# Patient Record
Sex: Female | Born: 2005 | Race: Black or African American | Hispanic: No | Marital: Single | Smoking: Never smoker
Health system: Southern US, Community
[De-identification: ages and names within clinical notes are randomized; demographics above are authoritative.]

## PROBLEM LIST (undated history)

## (undated) DIAGNOSIS — F649 Gender identity disorder, unspecified: Secondary | ICD-10-CM

## (undated) DIAGNOSIS — G44219 Episodic tension-type headache, not intractable: Secondary | ICD-10-CM

## (undated) DIAGNOSIS — E349 Endocrine disorder, unspecified: Secondary | ICD-10-CM

## (undated) DIAGNOSIS — F900 Attention-deficit hyperactivity disorder, predominantly inattentive type: Secondary | ICD-10-CM

## (undated) DIAGNOSIS — R51 Headache: Secondary | ICD-10-CM

## (undated) DIAGNOSIS — G43009 Migraine without aura, not intractable, without status migrainosus: Secondary | ICD-10-CM

## (undated) DIAGNOSIS — F4323 Adjustment disorder with mixed anxiety and depressed mood: Secondary | ICD-10-CM

## (undated) DIAGNOSIS — F5104 Psychophysiologic insomnia: Secondary | ICD-10-CM

## (undated) HISTORY — DX: Headache: R51

## (undated) NOTE — *Deleted (*Deleted)
Shared service with APP.  I have personally seen and examined the patient, providing direct face to face care.  Physical exam findings and plan include patient presents with intermittent heart palpitations and intermittent chest pain.  No shortness of breath or chest pain at this time however patient has had sharp chest pain recently.  No cardiac history.  Plan for screening blood work including D-dimer/troponin with worsening symptoms and patient on estrogen.  Patient well-appearing on assessment.  Rhythm strip reviewed showing normal heart rate, either artifact or PACs.  Reviewed with pediatric cardiology.  Patient will need outpatient follow-up.

---
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
## 2024-02-04 ENCOUNTER — Telehealth: Payer: MEDICAID | Admitting: Nurse Practitioner

## 2024-02-04 ENCOUNTER — Encounter: Payer: Self-pay | Admitting: Nurse Practitioner

## 2024-02-04 VITALS — BP 120/82 | HR 74 | Temp 97.6°F | Resp 18 | Ht 70.0 in | Wt 273.4 lb

## 2024-02-04 DIAGNOSIS — F331 Major depressive disorder, recurrent, moderate: Secondary | ICD-10-CM | POA: Insufficient documentation

## 2024-02-04 DIAGNOSIS — F900 Attention-deficit hyperactivity disorder, predominantly inattentive type: Secondary | ICD-10-CM | POA: Diagnosis not present

## 2024-02-04 DIAGNOSIS — F419 Anxiety disorder, unspecified: Secondary | ICD-10-CM | POA: Diagnosis not present

## 2024-02-04 NOTE — Assessment & Plan Note (Addendum)
 Continue Wellbutrin XL 150 mg PO QAM

## 2024-02-04 NOTE — Assessment & Plan Note (Addendum)
 Continue Clonidine  0.2 mg PO QHS Continue Concerta Er 27 mg PO QAM

## 2024-02-04 NOTE — Progress Notes (Signed)
 Subjective:  I'm doing pretty good    HPI: Alicia Powell is a 18 y.o. adult presenting on 02/04/2024 via telehealth. She is a previous patient of this provider at previous practice.    Patient reports she is doing well overall on her medications with no new mental health issues, concerns or complaints. She is taking 2 classes  at [employer] right now and reports school is going well. She reports continued improvement with focus, concentration and attention span. She also reports continued improvement with decreased anxiety, depressive symptoms, apprehensions and moods swings.    Patient denies poor appetite, sleep issues, adverse reaction to her medications, psychosis, delusions, suicidal or homicidal ideations.   ROS: Negative unless specifically indicated above in HPI.   Relevant past medical history reviewed and updated as indicated.   Allergies and medications reviewed and updated.   Current Outpatient Medications  Medication Instructions   alum & mag hydroxide-simeth (MAALOX MAX) 400-400-40 MG/5ML suspension 15 mLs, Oral, Every 6 hours PRN   busPIRone  (BUSPAR ) 10 mg, Oral, 2 times daily   citalopram  (CELEXA ) 20 mg, Oral, Daily at bedtime   cloNIDine  (CATAPRES ) 0.2 mg, Oral, Daily at bedtime   Concerta 27 mg, Oral, Every morning   famotidine  (PEPCID ) 20 mg, Oral, 2 times daily PRN   hydrOXYzine  (ATARAX ) 10 mg, Oral, Daily at bedtime   Insulin  Syringe-Needle U-100 (INSULIN  SYRINGE .5CC/28G) 28G X 1/2 0.5 ML MISC Use as directed with testosterone    norethindrone  (AYGESTIN ) 5 mg, Oral, Daily, Take 2 tabs for spotting or 3 tabs for bleeding. Return to 1 tab when bleeding stops.   Testosterone  Cypionate 50 mg, Subcutaneous, Weekly   testosterone  enanthate (DELATESTRYL ) 40 mg, Intramuscular, Every 7 days, This is 20 units on insulin  syringe. OK to give SUBCUTANEOUS     No Known Allergies  Objective:   BP 120/82 (BP Location: Right Arm, Patient Position: Sitting, Cuff Size:  Normal)   Pulse 74   Temp 97.6 F (36.4 C) (Temporal)   Resp 18   Ht 5' 10 (1.778 m)   Wt 273 lb 6.4 oz (124 kg)   SpO2 99%   BMI 39.23 kg/m    Physical Exam Constitutional:      Appearance: Normal appearance.  Neurological:     General: No focal deficit present.     Mental Status: He is alert and oriented to person, place, and time.  Psychiatric:        Attention and Perception: Attention and perception normal.        Mood and Affect: Mood normal. Mood is not anxious.        Speech: Speech normal.        Behavior: Behavior normal.        Thought Content: Thought content normal. Thought content is not paranoid or delusional. Thought content does not include homicidal or suicidal ideation.        Cognition and Memory: Cognition and memory normal.        Judgment: Judgment normal.    Assessment & Plan:   Assessment & Plan Attention deficit hyperactivity disorder (ADHD), predominantly inattentive type Continue Clonidine  0.2 mg PO QHS Continue Concerta Er 27 mg PO QAM    Anxiety Continue Buspar  15 mg PO BID     Moderate episode of recurrent major depressive disorder (HCC) Continue Wellbutrin XL 150 mg PO QAM        Follow up plan: Return in about 4 weeks (around 03/03/2024) for Medication Follow-up.  Florencia Cousin, NP

## 2024-02-04 NOTE — Assessment & Plan Note (Addendum)
 Continue Buspar  15 mg PO BID

## 2024-03-10 ENCOUNTER — Telehealth: Payer: MEDICAID | Admitting: Nurse Practitioner

## 2024-03-16 NOTE — H&P (Signed)
 Subjective Patient ID: Alicia Powell is a 18 y.o. adult.     HPI   Returns for follow up consultation gender affirming surgery, last seen 02/2023. Patient has identified as female with preferred name Alicia Powell for over 3 years in all aspects of life including home and school.    On testosterone  for 3.5 years.   Wt down 26 lb since 2024 consult.   No prior MMG. Reports MGM's sister with breast ca.   First year student [employer] in [employer]. Lives with parents who are supportive.    Review of Systems  Constitutional:  Positive for fatigue.  Cardiovascular:  Positive for chest pain.  Psychiatric/Behavioral:  Positive for dysphoric mood. The patient is nervous/anxious.   All other systems reviewed and are negative.    Objective Physical Exam  Cardiovascular: Normal rate, regular rhythm and normal heart sounds.    Pulmonary/Chest Effort normal and breath sounds normal.    Skin   Fitzpatrick 6     Lymph: no palpable axillary adenopathy   Breasts: no palpable masses, grade 3 ptosis bilateral SN to nipple R 30 L 30.6 cm BW R 24 L 23 cm Nipple to IMF R 9 L 9 cm Assessment/Plan Gender dysphoria Morbid obesity with BMI of 40.0-44.9, adult (CMD)   Plan bilateral mastectomies with free nipple grafts.    Discussed with patient mastectomy does not remove 100% breast tissue and will need to continue with self exams with aging with regards to breast cancer risk. Reviewed mastectomy will leave chest skin and nipple areola without feeling or ability to stimulate.    Given breast size and ptosis, discussed double incision mastectomy with FNG. Reviewed scars, drains, OP sugery, post op limitation pain and recovery. Reviewed with FNG able to reduce nipple height and areola diameter to approach cis female appearance but risks failure graft and hypopigmentation, may need additional procedures for this. Also this will leave nipple permanently without sensation and nipple will not stimulate.  Reviewed anticipated scar length, including across midline, and scar maturation over months. Plan FNGs. Reviewed common to have soft tissue excess lateral chest wall and may require additional procedures to treat this, weight loss would help reduce this risk.    Additional risks including but not limited to bleeding, infection, wound healing problems, asymmetry, damage to adjacent structures, need for additional procedures, hematoma, seroma, blood clots in legs or lungs reviewed. Completed ASPS consent for gender affirming chest surgery.   Patient is ok with breast binder placement at end of surgery.   Drain teaching completed. Rx for tramadol given.   Earlis Ranks, MD [facility]  Office/ physician access line after hours [phone]

## 2024-03-18 ENCOUNTER — Other Ambulatory Visit: Payer: Self-pay

## 2024-03-18 ENCOUNTER — Encounter (HOSPITAL_BASED_OUTPATIENT_CLINIC_OR_DEPARTMENT_OTHER): Payer: Self-pay | Admitting: Plastic Surgery

## 2024-03-18 NOTE — Progress Notes (Signed)
 Pt reports recent positive COVID test (02-09-24) with symptoms (runny nose, cough, sore throat, no fever) which resolved in 3 days.  Reviewed with Dr Corinne (anesthesia) who said OK to proceed with surgery as scheduled.

## 2024-03-18 NOTE — Progress Notes (Signed)
   03/18/24 1115  Pre-op Phone Call  Surgery Date Verified 03/25/24  Arrival Time Verified 1000  Surgery Location Verified [facility] [place]  Medical History Reviewed Yes  Is the patient taking a GLP-1 receptor agonist? No  Does the patient have diabetes? No diagnosis of diabetes  Do you have a history of heart problems? No  Does patient have other implanted devices? No  Patient Teaching Pre / Post Procedure  Patient educated about smoking cessation 24 hours prior to surgery. N/A Non-Smoker  Patient verbalizes understanding of bowel prep? N/A  Med Rec Completed Yes  Take the Following Meds the Morning of Surgery no meds DOS; hold nsaid/supp/vit x5d  Recent  Lab Work, EKG, CXR? No  NPO (Including gum & candy) After midnight  Patient instructed to stop clear liquids including Carb loading drink at: 0800  Stop Solids, Milk, Candy, and Gum STARTING AT MIDNIGHT  Responsible adult to drive and be with you for 24 hours? Yes  Name & Phone Number for Ride/Caregiver mom and/or dad  No Jewelry, money, nail polish or make-up.  No lotions, powders, perfumes. No shaving  48 hrs. prior to surgery. Yes  Contacts, Dentures & Glasses Will Have to be Removed Before OR. Yes  Please bring your ID and Insurance Card the morning of your surgery. (Surgery Centers Only) Yes  Bring any papers or x-rays with you that your surgeon gave you. Yes  Instructed to contact the location of procedure/ provider if they or anyone in their household develops symptoms or tests positive for COVID-19, has close contact with someone who tests positive for COVID, or has known exposure to any contagious illness. Yes  Call this number the morning of surgery  with any problems that may cancel your surgery. [phone]  Covid-19 Assessment  Have you had a positive COVID-19 test within the previous 90 days? (S)  Yes (02-09-24 positive test. Per pt: runny nose, cough, sore throat, no fever... symptoms resolved in 3 days and symptom free since then  with no residual (will disucss with anesthesia))  COVID Testing Guidance Patients with a positive COVID test should not be retested within 90 days. If patient tested positive within the previous 10 days (21 days if immunocompromised or required hospitalization for COVID) treat the patient as COVID positive (airborne/contact isolation) and notify anesthesiologist/surgeon to determine if surgery needs to be delayed.

## 2024-03-25 ENCOUNTER — Ambulatory Visit (HOSPITAL_BASED_OUTPATIENT_CLINIC_OR_DEPARTMENT_OTHER): Payer: MEDICAID | Admitting: Anesthesiology

## 2024-03-25 ENCOUNTER — Encounter (HOSPITAL_BASED_OUTPATIENT_CLINIC_OR_DEPARTMENT_OTHER): Payer: Self-pay | Admitting: Plastic Surgery

## 2024-03-25 ENCOUNTER — Encounter (HOSPITAL_BASED_OUTPATIENT_CLINIC_OR_DEPARTMENT_OTHER)
Admission: RE | Disposition: A | Discharge status: Home / Self Care | Payer: Self-pay | Source: Home / Self Care | Attending: Plastic Surgery

## 2024-03-25 ENCOUNTER — Ambulatory Visit (HOSPITAL_BASED_OUTPATIENT_CLINIC_OR_DEPARTMENT_OTHER)
Admission: RE | Admit: 2024-03-25 | Discharge: 2024-03-25 | Disposition: A | Discharge status: Home / Self Care | Payer: MEDICAID | Attending: Plastic Surgery | Admitting: Plastic Surgery

## 2024-03-25 DIAGNOSIS — F649 Gender identity disorder, unspecified: Secondary | ICD-10-CM | POA: Insufficient documentation

## 2024-03-25 DIAGNOSIS — F32A Depression, unspecified: Secondary | ICD-10-CM | POA: Insufficient documentation

## 2024-03-25 DIAGNOSIS — F419 Anxiety disorder, unspecified: Secondary | ICD-10-CM | POA: Insufficient documentation

## 2024-03-25 DIAGNOSIS — Z419 Encounter for procedure for purposes other than remedying health state, unspecified: Secondary | ICD-10-CM

## 2024-03-25 DIAGNOSIS — Z68.41 Body mass index (BMI) pediatric, 120% of the 95th percentile for age to less than 140% of the 95th percentile for age: Secondary | ICD-10-CM | POA: Insufficient documentation

## 2024-03-25 DIAGNOSIS — R519 Headache, unspecified: Secondary | ICD-10-CM | POA: Insufficient documentation

## 2024-03-25 HISTORY — PX: TOTAL MASTECTOMY: SHX6129

## 2024-03-25 HISTORY — PX: SKIN FULL THICKNESS GRAFT: SHX442

## 2024-03-25 LAB — POCT PREGNANCY, URINE: Preg Test, Ur: NEGATIVE

## 2024-03-25 SURGERY — MASTECTOMY, SIMPLE
Anesthesia: General | Site: Breast | Laterality: Bilateral

## 2024-03-25 MED ORDER — LIDOCAINE HCL (CARDIAC) PF 100 MG/5ML IV SOSY
PREFILLED_SYRINGE | INTRAVENOUS | Status: DC | PRN
  Administered 2024-03-25: 100 mg via INTRAVENOUS

## 2024-03-25 MED ORDER — FENTANYL CITRATE (PF) 100 MCG/2ML IJ SOLN
INTRAMUSCULAR | Status: AC
  Filled 2024-03-25: qty 2

## 2024-03-25 MED ORDER — LACTATED RINGERS IV SOLN
INTRAVENOUS | Status: DC
Start: 2024-03-25 — End: 2024-03-25

## 2024-03-25 MED ORDER — ROCURONIUM BROMIDE 100 MG/10ML IV SOLN
INTRAVENOUS | Status: DC | PRN
  Administered 2024-03-25: 70 mg via INTRAVENOUS

## 2024-03-25 MED ORDER — ACETAMINOPHEN 500 MG PO TABS
1000.0000 mg | ORAL_TABLET | ORAL | Status: AC
  Administered 2024-03-25: 1000 mg via ORAL

## 2024-03-25 MED ORDER — CHLORHEXIDINE GLUCONATE CLOTH 2 % EX PADS: 6.0000 | MEDICATED_PAD | Freq: Once | CUTANEOUS | Status: DC

## 2024-03-25 MED ORDER — ROCURONIUM BROMIDE 10 MG/ML (PF) SYRINGE
PREFILLED_SYRINGE | INTRAVENOUS | Status: AC
  Filled 2024-03-25: qty 10

## 2024-03-25 MED ORDER — DEXAMETHASONE SOD PHOSPHATE PF 10 MG/ML IJ SOLN
INTRAMUSCULAR | Status: DC | PRN
  Administered 2024-03-25: 10 mg via INTRAVENOUS

## 2024-03-25 MED ORDER — PHENYLEPHRINE 80 MCG/ML (10ML) SYRINGE FOR IV PUSH (FOR BLOOD PRESSURE SUPPORT)
PREFILLED_SYRINGE | INTRAVENOUS | Status: AC
Start: 2024-03-25 — End: 2024-03-25
  Filled 2024-03-25: qty 10

## 2024-03-25 MED ORDER — OXYCODONE HCL 5 MG PO TABS: 5.0000 mg | ORAL_TABLET | Freq: Once | ORAL | Status: DC | PRN

## 2024-03-25 MED ORDER — PROPOFOL 10 MG/ML IV BOLUS
INTRAVENOUS | Status: AC
  Filled 2024-03-25: qty 20

## 2024-03-25 MED ORDER — PROPOFOL 10 MG/ML IV BOLUS
INTRAVENOUS | Status: DC | PRN
  Administered 2024-03-25: 200 mg via INTRAVENOUS

## 2024-03-25 MED ORDER — BUPIVACAINE HCL (PF) 0.5 % IJ SOLN
INTRAMUSCULAR | Status: DC | PRN
  Administered 2024-03-25: 30 mL

## 2024-03-25 MED ORDER — MIDAZOLAM HCL 5 MG/5ML IJ SOLN
INTRAMUSCULAR | Status: DC | PRN
  Administered 2024-03-25: 2 mg via INTRAVENOUS

## 2024-03-25 MED ORDER — GABAPENTIN 300 MG PO CAPS
300.0000 mg | ORAL_CAPSULE | ORAL | Status: AC
  Administered 2024-03-25: 300 mg via ORAL

## 2024-03-25 MED ORDER — BUPIVACAINE HCL (PF) 0.5 % IJ SOLN
INTRAMUSCULAR | Status: AC
Start: 2024-03-25 — End: 2024-03-25
  Filled 2024-03-25: qty 30

## 2024-03-25 MED ORDER — CEFAZOLIN SODIUM-DEXTROSE 2-4 GM/100ML-% IV SOLN
INTRAVENOUS | Status: AC
  Filled 2024-03-25: qty 100

## 2024-03-25 MED ORDER — GABAPENTIN 300 MG PO CAPS
ORAL_CAPSULE | ORAL | Status: AC
Start: 2024-03-25 — End: 2024-03-25
  Filled 2024-03-25: qty 1

## 2024-03-25 MED ORDER — ONDANSETRON HCL 4 MG/2ML IJ SOLN
INTRAMUSCULAR | Status: DC | PRN
  Administered 2024-03-25: 4 mg via INTRAVENOUS

## 2024-03-25 MED ORDER — ACETAMINOPHEN 500 MG PO TABS
ORAL_TABLET | ORAL | Status: AC
  Filled 2024-03-25: qty 2

## 2024-03-25 MED ORDER — FENTANYL CITRATE (PF) 100 MCG/2ML IJ SOLN
25.0000 ug | INTRAMUSCULAR | Status: DC | PRN
  Administered 2024-03-25: 25 ug via INTRAVENOUS

## 2024-03-25 MED ORDER — CELECOXIB 200 MG PO CAPS
ORAL_CAPSULE | ORAL | Status: AC
  Filled 2024-03-25: qty 1

## 2024-03-25 MED ORDER — CEFAZOLIN SODIUM-DEXTROSE 2-4 GM/100ML-% IV SOLN
2.0000 g | INTRAVENOUS | Status: AC
  Administered 2024-03-25: 2 g via INTRAVENOUS

## 2024-03-25 MED ORDER — CELECOXIB 200 MG PO CAPS
200.0000 mg | ORAL_CAPSULE | ORAL | Status: AC
  Administered 2024-03-25: 200 mg via ORAL

## 2024-03-25 MED ORDER — PHENYLEPHRINE HCL (PRESSORS) 10 MG/ML IV SOLN
INTRAVENOUS | Status: DC | PRN
  Administered 2024-03-25 (×4): 80 ug via INTRAVENOUS

## 2024-03-25 MED ORDER — KETAMINE HCL 50 MG/5ML IJ SOSY
PREFILLED_SYRINGE | INTRAMUSCULAR | Status: AC
Start: 2024-03-25 — End: 2024-03-25
  Filled 2024-03-25: qty 5

## 2024-03-25 MED ORDER — BACITRACIN ZINC 500 UNIT/GM EX OINT
TOPICAL_OINTMENT | CUTANEOUS | Status: AC
  Filled 2024-03-25: qty 0.9

## 2024-03-25 MED ORDER — ONDANSETRON HCL 4 MG/2ML IJ SOLN: 4.0000 mg | Freq: Once | INTRAMUSCULAR | Status: DC | PRN

## 2024-03-25 MED ORDER — BACITRACIN 500 UNIT/GM EX OINT
TOPICAL_OINTMENT | CUTANEOUS | Status: DC | PRN
  Administered 2024-03-25: 1 via TOPICAL

## 2024-03-25 MED ORDER — MIDAZOLAM HCL 2 MG/2ML IJ SOLN
INTRAMUSCULAR | Status: AC
Start: 2024-03-25 — End: 2024-03-25
  Filled 2024-03-25: qty 2

## 2024-03-25 MED ORDER — PROPOFOL 500 MG/50ML IV EMUL
INTRAVENOUS | Status: AC
  Filled 2024-03-25: qty 50

## 2024-03-25 MED ORDER — FENTANYL CITRATE (PF) 100 MCG/2ML IJ SOLN
INTRAMUSCULAR | Status: DC | PRN
  Administered 2024-03-25 (×2): 50 ug via INTRAVENOUS

## 2024-03-25 MED ORDER — SUGAMMADEX SODIUM 200 MG/2ML IV SOLN
INTRAVENOUS | Status: DC | PRN
  Administered 2024-03-25: 200 mg via INTRAVENOUS

## 2024-03-25 MED ORDER — ACETAMINOPHEN 10 MG/ML IV SOLN: 1000.0000 mg | Freq: Once | INTRAVENOUS | Status: DC | PRN

## 2024-03-25 MED ORDER — PROPOFOL 500 MG/50ML IV EMUL
INTRAVENOUS | Status: DC | PRN
  Administered 2024-03-25: 50 ug/kg/min via INTRAVENOUS

## 2024-03-25 MED ORDER — OXYCODONE HCL 5 MG/5ML PO SOLN: 5.0000 mg | Freq: Once | ORAL | Status: DC | PRN

## 2024-03-25 MED ORDER — LIDOCAINE 2% (20 MG/ML) 5 ML SYRINGE
INTRAMUSCULAR | Status: AC
  Filled 2024-03-25: qty 5

## 2024-03-25 MED ORDER — 0.9 % SODIUM CHLORIDE (POUR BTL) OPTIME
TOPICAL | Status: DC | PRN
  Administered 2024-03-25: 100 mL

## 2024-03-25 MED ORDER — ONDANSETRON HCL 4 MG/2ML IJ SOLN
INTRAMUSCULAR | Status: AC
Start: 2024-03-25 — End: 2024-03-25
  Filled 2024-03-25: qty 2

## 2024-03-25 MED ORDER — KETAMINE HCL 10 MG/ML IJ SOLN
INTRAMUSCULAR | Status: DC | PRN
  Administered 2024-03-25: 20 mg via INTRAVENOUS
  Administered 2024-03-25: 10 mg via INTRAVENOUS
  Administered 2024-03-25: 20 mg via INTRAVENOUS

## 2024-03-25 SURGICAL SUPPLY — 50 items
BINDER BREAST XXLRG (GAUZE/BANDAGES/DRESSINGS) IMPLANT
BLADE CLIPPER SURG (BLADE) IMPLANT
BLADE SURG 10 STRL SS (BLADE) ×2 IMPLANT
BLADE SURG 15 STRL LF DISP TIS (BLADE) ×2 IMPLANT
BRUSH SCRUB EZ PLAIN DRY (MISCELLANEOUS) ×1 IMPLANT
CANISTER SUCT 1200ML W/VALVE (MISCELLANEOUS) ×1 IMPLANT
CHLORAPREP W/TINT 26 (MISCELLANEOUS) ×2 IMPLANT
COVER BACK TABLE 60X90IN (DRAPES) ×1 IMPLANT
COVER MAYO STAND STRL (DRAPES) ×1 IMPLANT
DERMABOND ADVANCED .7 DNX12 (GAUZE/BANDAGES/DRESSINGS) ×2 IMPLANT
DRAIN CHANNEL 19F RND (DRAIN) IMPLANT
DRAPE TOP ARMCOVERS (MISCELLANEOUS) ×1 IMPLANT
DRAPE U-SHAPE 76X120 STRL (DRAPES) ×1 IMPLANT
DRAPE UTILITY XL STRL (DRAPES) ×1 IMPLANT
DRSG EMULSION OIL 3X3 NADH (GAUZE/BANDAGES/DRESSINGS) ×1 IMPLANT
ELECT COATED BLADE 2.86 ST (ELECTRODE) ×1 IMPLANT
ELECT NDL BLADE 2-5/6 (NEEDLE) ×1 IMPLANT
ELECT NEEDLE BLADE 2-5/6 (NEEDLE) ×1 IMPLANT
ELECTRODE REM PT RTRN 9FT ADLT (ELECTROSURGICAL) ×1 IMPLANT
EVACUATOR SILICONE 100CC (DRAIN) IMPLANT
GAUZE PAD ABD 8X10 STRL (GAUZE/BANDAGES/DRESSINGS) ×2 IMPLANT
GLOVE BIO SURGEON STRL SZ 6 (GLOVE) ×2 IMPLANT
GLOVE BIOGEL PI IND STRL 7.5 (GLOVE) IMPLANT
GLOVE SURG SYN 7.0 PF PI (GLOVE) IMPLANT
GOWN STRL REUS W/ TWL LRG LVL3 (GOWN DISPOSABLE) ×2 IMPLANT
GOWN STRL REUS W/ TWL XL LVL3 (GOWN DISPOSABLE) IMPLANT
NDL HYPO 25X1 1.5 SAFETY (NEEDLE) ×1 IMPLANT
NEEDLE HYPO 25X1 1.5 SAFETY (NEEDLE) ×1 IMPLANT
PACK BASIN DAY SURGERY FS (CUSTOM PROCEDURE TRAY) ×1 IMPLANT
PENCIL SMOKE EVACUATOR (MISCELLANEOUS) ×1 IMPLANT
PIN SAFETY STERILE (MISCELLANEOUS) IMPLANT
SHEET MEDIUM DRAPE 40X70 STRL (DRAPES) ×1 IMPLANT
SLEEVE SCD COMPRESS KNEE MED (STOCKING) ×1 IMPLANT
SOLN 0.9% NACL POUR BTL 1000ML (IV SOLUTION) ×1 IMPLANT
SPONGE T-LAP 18X18 ~~LOC~~+RFID (SPONGE) ×2 IMPLANT
STAPLER SKIN PROX WIDE 3.9 (STAPLE) ×1 IMPLANT
SUT ETHILON 2 0 FS 18 (SUTURE) IMPLANT
SUT MNCRL AB 4-0 PS2 18 (SUTURE) IMPLANT
SUT PDS AB 2-0 CT2 27 (SUTURE) IMPLANT
SUT SILK 2 0 SH (SUTURE) IMPLANT
SUT SILK 4 0 PS 2 (SUTURE) IMPLANT
SUT VIC AB 3-0 PS1 18XBRD (SUTURE) IMPLANT
SUT VICRYL RAPIDE 4-0 (SUTURE) IMPLANT
SYR 10ML LL (SYRINGE) ×1 IMPLANT
SYR BULB IRRIG 60ML STRL (SYRINGE) ×1 IMPLANT
SYR CONTROL 10ML LL (SYRINGE) ×1 IMPLANT
TOWEL GREEN STERILE FF (TOWEL DISPOSABLE) ×2 IMPLANT
TUBE CONNECTING 20X1/4 (TUBING) ×1 IMPLANT
UNDERPAD 30X36 HEAVY ABSORB (UNDERPADS AND DIAPERS) ×2 IMPLANT
YANKAUER SUCT BULB TIP NO VENT (SUCTIONS) ×1 IMPLANT

## 2024-03-25 NOTE — Anesthesia Preprocedure Evaluation (Addendum)
 Anesthesia Evaluation  Patient identified by MRN, date of birth, ID band Patient awake    Reviewed: Allergy & Precautions, NPO status , Patient's Chart, lab work & pertinent test results, reviewed documented beta blocker date and time   History of Anesthesia Complications Negative for: history of anesthetic complications  Airway Mallampati: II  TM Distance: >3 FB     Dental no notable dental hx.    Pulmonary neg COPD, neg PE   breath sounds clear to auscultation       Cardiovascular (-) angina (-) CAD and (-) Past MI  Rhythm:Regular Rate:Normal     Neuro/Psych  Headaches, neg Seizures PSYCHIATRIC DISORDERS Anxiety Depression       GI/Hepatic ,neg GERD  ,,(+) neg Cirrhosis        Endo/Other    Renal/GU Renal disease     Musculoskeletal   Abdominal   Peds  Hematology   Anesthesia Other Findings   Reproductive/Obstetrics                              Anesthesia Physical Anesthesia Plan  ASA: 2  Anesthesia Plan: General   Post-op Pain Management:    Induction: Intravenous  PONV Risk Score and Plan: 2 and Ondansetron and Dexamethasone  Airway Management Planned: Oral ETT  Additional Equipment:   Intra-op Plan:   Post-operative Plan: Extubation in OR  Informed Consent: I have reviewed the patients History and Physical, chart, labs and discussed the procedure including the risks, benefits and alternatives for the proposed anesthesia with the patient or authorized representative who has indicated his/her understanding and acceptance.     Dental advisory given  Plan Discussed with: CRNA  Anesthesia Plan Comments:          Anesthesia Quick Evaluation

## 2024-03-25 NOTE — Anesthesia Postprocedure Evaluation (Signed)
 Anesthesia Post Note  Patient: Alicia Powell  Procedure(s) Performed: MASTECTOMY, SIMPLE (Bilateral: Breast) APPLICATION, GRAFT, SKIN, FULL-THICKNESS (Bilateral: Breast)     Patient location during evaluation: PACU Anesthesia Type: General Level of consciousness: awake and alert Pain management: pain level controlled Vital Signs Assessment: post-procedure vital signs reviewed and stable Respiratory status: spontaneous breathing, nonlabored ventilation, respiratory function stable and patient connected to nasal cannula oxygen Cardiovascular status: blood pressure returned to baseline and stable Postop Assessment: no apparent nausea or vomiting Anesthetic complications: no   No notable events documented.  Last Vitals:  Vitals:   03/25/24 1600 03/25/24 1700  BP: 122/73 128/85  Pulse: 94 90  Resp: 19 20  Temp:  (!) 36.3 C  SpO2: 95% 93%    Last Pain:  Vitals:   03/25/24 1700  TempSrc: Temporal  PainSc:                  Lynwood MARLA Cornea

## 2024-03-25 NOTE — Transfer of Care (Signed)
 Immediate Anesthesia Transfer of Care Note  Patient: Alicia Powell  Procedure(s) Performed: MASTECTOMY, SIMPLE (Bilateral: Breast) APPLICATION, GRAFT, SKIN, FULL-THICKNESS (Bilateral: Breast)  Patient Location: PACU  Anesthesia Type:General  Level of Consciousness: drowsy and patient cooperative  Airway & Oxygen Therapy: Patient Spontanous Breathing and Patient connected to face mask oxygen  Post-op Assessment: Report given to RN and Post -op Vital signs reviewed and stable  Post vital signs: Reviewed and stable  Last Vitals:  Vitals Value Taken Time  BP 122/69 03/25/24 15:22  Temp    Pulse 100 03/25/24 15:22  Resp 14 03/25/24 15:22  SpO2 99 % 03/25/24 15:22  Vitals shown include unfiled device data.  Last Pain:  Vitals:   03/25/24 1004  TempSrc: Temporal  PainSc: 0-No pain         Complications: No notable events documented.

## 2024-03-25 NOTE — Discharge Instructions (Addendum)
  Post Anesthesia Home Care Instructions  Activity: Get plenty of rest for the remainder of the day. A responsible individual must stay with you for 24 hours following the procedure.  For the next 24 hours, DO NOT: -Drive a car -Advertising copywriter -Drink alcoholic beverages -Take any medication unless instructed by your physician -Make any legal decisions or sign important papers.  Meals: Start with liquid foods such as gelatin or soup. Progress to regular foods as tolerated. Avoid greasy, spicy, heavy foods. If nausea and/or vomiting occur, drink only clear liquids until the nausea and/or vomiting subsides. Call your physician if vomiting continues.  Special Instructions/Symptoms: Your throat may feel dry or sore from the anesthesia or the breathing tube placed in your throat during surgery. If this causes discomfort, gargle with warm salt water. The discomfort should disappear within 24 hours.  If you had a scopolamine patch placed behind your ear for the management of post- operative nausea and/or vomiting:  1. The medication in the patch is effective for 72 hours, after which it should be removed.  Wrap patch in a tissue and discard in the trash. Wash hands thoroughly with soap and water. 2. You may remove the patch earlier than 72 hours if you experience unpleasant side effects which may include dry mouth, dizziness or visual disturbances. 3. Avoid touching the patch. Wash your hands with soap and water after contact with the patch.    Next dose of tylenol will be at 4pm Next dose of ibuprofen  will be after 6pm

## 2024-03-25 NOTE — Interval H&P Note (Signed)
 History and Physical Interval Note:  03/25/2024 10:23 AM  Alicia Powell  has presented today for surgery, with the diagnosis of gender dysphoria.  The various methods of treatment have been discussed with the patient and family. After consideration of risks, benefits and other options for treatment, the patient has consented to  bilateral mastectomies with free nipple grafts as a surgical intervention.  The patient's history has been reviewed, patient examined, no change in status, stable for surgery.  I have reviewed the patient's chart and labs.  Questions were answered to the patient's satisfaction.     Earlis Sophiya Morello

## 2024-03-25 NOTE — Anesthesia Procedure Notes (Signed)
 Procedure Name: Intubation Date/Time: 03/25/2024 11:16 AM  Performed by: Frost Kayla MATSU, CRNAPre-anesthesia Checklist: Patient identified, Emergency Drugs available, Suction available and Patient being monitored Patient Re-evaluated:Patient Re-evaluated prior to induction Oxygen Delivery Method: Circle system utilized Preoxygenation: Pre-oxygenation with 100% oxygen Induction Type: IV induction Ventilation: Mask ventilation without difficulty Laryngoscope Size: Miller and 3 Grade View: Grade I Tube type: Oral Tube size: 7.0 mm Number of attempts: 1 Airway Equipment and Method: Stylet and Oral airway Placement Confirmation: ETT inserted through vocal cords under direct vision, positive ETCO2 and breath sounds checked- equal and bilateral Secured at: 21 cm Tube secured with: Tape Dental Injury: Teeth and Oropharynx as per pre-operative assessment

## 2024-03-25 NOTE — Op Note (Signed)
 Operative Note   DATE OF OPERATION: 11.7.2025  LOCATION: [facility]-outpatient  SURGICAL DIVISION: Plastic Surgery  PREOPERATIVE DIAGNOSES:  Gender dysphoria  POSTOPERATIVE DIAGNOSES:  same  PROCEDURE:  Bilateral mastectomies with free nipple grafts  SURGEON: Earlis Ranks MD MBA  ASSISTANT: none  ANESTHESIA:  General.   EBL: 50 ml  COMPLICATIONS: None immediate.   INDICATIONS FOR PROCEDURE:  The patient, Alicia Powell, is a 18 y.o. adult born on 2005-07-31, is here for gender affirming chest surgery.   FINDINGS: Bilateral simple mastectomies completed with free nipple grafts.  DESCRIPTION OF PROCEDURE:  The patient's operative site was marked with the patient in the preoperative area. Patient marked in standing position for anticipated skin resection. The patient was taken to the operating room. SCDs were placed and IV antibiotics were given. The patient's operative site was prepped and draped in a sterile fashion. A time out was performed and all information was confirmed to be correct. Transverse elliptical excision drawn over bilateral breast with lower border superior to inframammary fold to include NAC.  I began on left breast. The nipple areolar complex marked with 25 mm diameter. The NAC was incised and harvested as full thickness graft. The graft was placed in saline guaze. Remainder incisions completed and subcutaneous dissection completed to all clinical extents of breast. Inferiorly dissection completed to ensure disruption inframammary fold. The breast was then dissected off chest wall taking care to leave pectoralis fascia intact. The cavity was irrigated and hemostasis obtained. Local anesthetic infiltrated. 19 Fr JP placed and secured with 2-0 nylon. Progressive tension sutures placed with 2-0 PDS to advance the mastectomy flap inferiorly. IMF incision closed with interrupted 2-0 PDS suture as 3 point suture of superficial fascia of flaps to chest wall. 3-0  vicryl placed in dermis followed by 4-0 monocyl subcuticular skin closure.   I then directed attention to right breast. Similar full thickness NAC graft harvested. Mastectomy completed and drain placed. Local anesthetic infiltrated. Closure completed in similar fashion following placement of progressive tension sutures. Tissue adhesive applied to IMF incisions.  Location for NAC grafts marked symmetrically over chest, 14 cm from midline just medial to pectoralis border. Areas for inset grafts were deepithelialized. Grafts prepared by excision of breast tissue from under surface. The grafts were inset with 4-0 vicryl rapide suture. Antibiotic ointment applied followed by adaptic and sponge bolster dressing secured with 4-0 silk. Dry dressing and breast binder applied.   The patient was allowed to wake from anesthesia, extubated and taken to the recovery room in satisfactory condition.   SPECIMENS: right and left mastectomy  DRAINS: 19 Fr JP in right and left subcutaneous chest  Earlis Ranks, MD [facility] Plastic & Reconstructive Surgery  Office/ physician access line after hours [phone]

## 2024-03-26 ENCOUNTER — Encounter (HOSPITAL_BASED_OUTPATIENT_CLINIC_OR_DEPARTMENT_OTHER): Payer: Self-pay | Admitting: Plastic Surgery

## 2024-03-29 LAB — SURGICAL PATHOLOGY

## 2024-06-02 ENCOUNTER — Telehealth: Payer: MEDICAID | Admitting: Nurse Practitioner

## 2024-06-02 NOTE — Progress Notes (Unsigned)
" ° °  Subjective:  No chief complaint on file.    HPI: Alicia Powell is a 19 y.o. adult presenting on 06/02/2024 with report of ***  Taking 4 classes 2 and 2 block courses  ROS: Negative unless specifically indicated above in HPI.   Relevant past medical history reviewed and updated as indicated.   Allergies and medications reviewed and updated.   Current Outpatient Medications  Medication Instructions   busPIRone  (BUSPAR ) 10 mg, 2 times daily   cloNIDine  (CATAPRES ) 0.2 mg, Daily at bedtime   Concerta 27 mg, Every morning   hydrOXYzine  (ATARAX ) 10 mg, Daily at bedtime   Insulin  Syringe-Needle U-100 (INSULIN  SYRINGE .5CC/28G) 28G X 1/2 0.5 ML MISC Use as directed with testosterone    Testosterone  Cypionate 50 mg, Subcutaneous, Weekly   testosterone  enanthate (DELATESTRYL ) 40 mg, Intramuscular, Every 7 days, This is 20 units on insulin  syringe. OK to give SUBCUTANEOUS     Allergies[1]  Objective:   There were no vitals taken for this visit.   Physical Exam Constitutional:      Appearance: Normal appearance.  HENT:     Head: Normocephalic.  Eyes:     Conjunctiva/sclera: Conjunctivae normal.  Cardiovascular:     Rate and Rhythm: Normal rate and regular rhythm.  Pulmonary:     Effort: Pulmonary effort is normal.     Breath sounds: Normal breath sounds.  Skin:    General: Skin is warm and dry.  Neurological:     General: No focal deficit present.     Mental Status: He is alert and oriented to person, place, and time.  Psychiatric:        Mood and Affect: Mood normal.        Behavior: Behavior normal.        Thought Content: Thought content normal.        Judgment: Judgment normal.     Eye Contact:  {BHH EYE CONTACT:22684}  Speech:  {Speech:22685}  Volume:  {Volume (PAA):22686}  Mood:  {BHH MOOD:22306}  Affect:  {Affect (PAA):22687}  Thought Process:  {Thought Process (PAA):22688}  Orientation:  {BHH ORIENTATION (PAA):22689}  Thought Content:  {Thought  Content:22690}  Suicidal Thoughts:  {ST/HT (PAA):22692}  Homicidal Thoughts:  {ST/HT (PAA):22692}  Memory:  {BHH MEMORY:22881}  Judgement:  {Judgement (PAA):22694}  Insight:  {Insight (PAA):22695}  Psychomotor Activity:  {Psychomotor (PAA):22696}  Concentration:  {Concentration:21399}  Recall:  {BHH GOOD/FAIR/POOR:22877}  Fund of Knowledge:  {BHH GOOD/FAIR/POOR:22877}  Language:  {BHH GOOD/FAIR/POOR:22877}  Akathisia:  {BHH YES OR NO:22294}  Handed:  {Handed:22697}  AIMS (if indicated):     Assets:  {Assets (PAA):22698}  ADL's:  {BHH JIO'D:77709}  Cognition:  {chl bhh cognition:304700322}  Sleep:        Assessment & Plan:   Assessment & Plan     Follow up plan: No follow-ups on file.  Florencia Cousin, NP     [1] No Known Allergies  "

## 2024-06-30 ENCOUNTER — Telehealth: Payer: MEDICAID | Admitting: Nurse Practitioner
